# Patient Record
Sex: Female | Born: 1935 | Race: White | Hispanic: No | Marital: Married | Smoking: Never smoker
Health system: Southern US, Community
[De-identification: ages and names within clinical notes are randomized; demographics above are authoritative.]

## PROBLEM LIST (undated history)

## (undated) DIAGNOSIS — I10 Essential (primary) hypertension: Secondary | ICD-10-CM

## (undated) DIAGNOSIS — K5900 Constipation, unspecified: Secondary | ICD-10-CM

## (undated) DIAGNOSIS — I639 Cerebral infarction, unspecified: Secondary | ICD-10-CM

## (undated) DIAGNOSIS — M1990 Unspecified osteoarthritis, unspecified site: Secondary | ICD-10-CM

## (undated) DIAGNOSIS — E039 Hypothyroidism, unspecified: Secondary | ICD-10-CM

## (undated) DIAGNOSIS — E669 Obesity, unspecified: Secondary | ICD-10-CM

## (undated) DIAGNOSIS — E785 Hyperlipidemia, unspecified: Secondary | ICD-10-CM

## (undated) HISTORY — DX: Cerebral infarction, unspecified: I63.9

## (undated) HISTORY — DX: Obesity, unspecified: E66.9

## (undated) HISTORY — DX: Hypothyroidism, unspecified: E03.9

## (undated) HISTORY — DX: Unspecified osteoarthritis, unspecified site: M19.90

## (undated) HISTORY — DX: Hyperlipidemia, unspecified: E78.5

## (undated) HISTORY — PX: JOINT REPLACEMENT: SHX530

## (undated) HISTORY — DX: Essential (primary) hypertension: I10

## (undated) HISTORY — DX: Constipation, unspecified: K59.00

---

## 2001-02-27 ENCOUNTER — Other Ambulatory Visit: Admission: RE | Admit: 2001-02-27 | Discharge: 2001-02-27 | Payer: Self-pay | Admitting: Family Medicine

## 2001-02-27 ENCOUNTER — Encounter: Payer: Self-pay | Admitting: Family Medicine

## 2004-08-30 ENCOUNTER — Ambulatory Visit: Payer: Self-pay | Admitting: Family Medicine

## 2004-10-31 ENCOUNTER — Ambulatory Visit: Payer: Self-pay | Admitting: Family Medicine

## 2005-02-01 ENCOUNTER — Ambulatory Visit: Payer: Self-pay | Admitting: Unknown Physician Specialty

## 2005-04-27 ENCOUNTER — Ambulatory Visit: Payer: Self-pay | Admitting: Family Medicine

## 2005-05-02 ENCOUNTER — Ambulatory Visit: Payer: Self-pay | Admitting: Family Medicine

## 2005-05-16 ENCOUNTER — Ambulatory Visit: Payer: Self-pay | Admitting: Family Medicine

## 2005-06-21 ENCOUNTER — Ambulatory Visit: Payer: Self-pay | Admitting: Family Medicine

## 2005-10-03 ENCOUNTER — Ambulatory Visit: Payer: Self-pay | Admitting: Family Medicine

## 2005-11-02 ENCOUNTER — Ambulatory Visit: Payer: Self-pay | Admitting: Family Medicine

## 2006-05-04 ENCOUNTER — Ambulatory Visit: Payer: Self-pay | Admitting: Family Medicine

## 2006-05-07 ENCOUNTER — Ambulatory Visit: Payer: Self-pay | Admitting: Family Medicine

## 2006-05-24 ENCOUNTER — Ambulatory Visit: Payer: Self-pay | Admitting: Family Medicine

## 2006-07-03 ENCOUNTER — Ambulatory Visit: Payer: Self-pay | Admitting: Family Medicine

## 2006-08-29 ENCOUNTER — Ambulatory Visit: Payer: Self-pay | Admitting: Family Medicine

## 2006-09-26 ENCOUNTER — Ambulatory Visit: Payer: Self-pay | Admitting: Family Medicine

## 2006-11-07 ENCOUNTER — Ambulatory Visit: Payer: Self-pay | Admitting: Family Medicine

## 2007-03-08 ENCOUNTER — Ambulatory Visit: Payer: Self-pay | Admitting: Internal Medicine

## 2007-03-08 DIAGNOSIS — L538 Other specified erythematous conditions: Secondary | ICD-10-CM | POA: Insufficient documentation

## 2007-04-01 DIAGNOSIS — R209 Unspecified disturbances of skin sensation: Secondary | ICD-10-CM

## 2007-04-15 ENCOUNTER — Ambulatory Visit: Payer: Self-pay | Admitting: Internal Medicine

## 2007-04-18 ENCOUNTER — Ambulatory Visit: Payer: Self-pay | Admitting: Internal Medicine

## 2007-04-18 ENCOUNTER — Encounter: Payer: Self-pay | Admitting: Internal Medicine

## 2007-04-20 ENCOUNTER — Inpatient Hospital Stay: Payer: Self-pay | Admitting: Internal Medicine

## 2007-04-20 ENCOUNTER — Other Ambulatory Visit: Payer: Self-pay

## 2007-04-22 ENCOUNTER — Other Ambulatory Visit: Payer: Self-pay

## 2007-04-24 ENCOUNTER — Encounter: Payer: Self-pay | Admitting: Family Medicine

## 2007-04-25 ENCOUNTER — Encounter: Payer: Self-pay | Admitting: Internal Medicine

## 2007-05-06 ENCOUNTER — Ambulatory Visit: Payer: Self-pay | Admitting: Family Medicine

## 2007-05-06 DIAGNOSIS — E669 Obesity, unspecified: Secondary | ICD-10-CM | POA: Insufficient documentation

## 2007-05-06 DIAGNOSIS — E785 Hyperlipidemia, unspecified: Secondary | ICD-10-CM | POA: Insufficient documentation

## 2007-05-06 DIAGNOSIS — I6789 Other cerebrovascular disease: Secondary | ICD-10-CM | POA: Insufficient documentation

## 2007-05-06 LAB — CONVERTED CEMR LAB
AST: 23 units/L (ref 0–37)
Albumin: 3.8 g/dL (ref 3.5–5.2)
Alkaline Phosphatase: 65 units/L (ref 39–117)
BUN: 13 mg/dL (ref 6–23)
Basophils Absolute: 0 10*3/uL (ref 0.0–0.1)
Basophils Relative: 0.3 % (ref 0.0–1.0)
CO2: 29 meq/L (ref 19–32)
Chloride: 105 meq/L (ref 96–112)
Creatinine, Ser: 1.3 mg/dL — ABNORMAL HIGH (ref 0.4–1.2)
HCT: 43.8 % (ref 36.0–46.0)
Hemoglobin: 15.3 g/dL — ABNORMAL HIGH (ref 12.0–15.0)
MCHC: 35 g/dL (ref 30.0–36.0)
Monocytes Relative: 9.8 % (ref 3.0–11.0)
Neutrophils Relative %: 61.2 % (ref 43.0–77.0)
RBC: 4.84 M/uL (ref 3.87–5.11)
RDW: 12.5 % (ref 11.5–14.6)
Total Bilirubin: 1.3 mg/dL — ABNORMAL HIGH (ref 0.3–1.2)
Total CHOL/HDL Ratio: 6.4
Total Protein: 6.4 g/dL (ref 6.0–8.3)
Triglycerides: 406 mg/dL (ref 0–149)
VLDL: 81 mg/dL — ABNORMAL HIGH (ref 0–40)

## 2007-05-07 ENCOUNTER — Encounter: Payer: Self-pay | Admitting: Family Medicine

## 2007-05-07 DIAGNOSIS — E039 Hypothyroidism, unspecified: Secondary | ICD-10-CM | POA: Insufficient documentation

## 2007-05-07 DIAGNOSIS — M129 Arthropathy, unspecified: Secondary | ICD-10-CM

## 2007-05-13 ENCOUNTER — Ambulatory Visit: Payer: Self-pay | Admitting: Family Medicine

## 2007-05-13 DIAGNOSIS — I10 Essential (primary) hypertension: Secondary | ICD-10-CM

## 2007-05-14 ENCOUNTER — Telehealth: Payer: Self-pay | Admitting: Family Medicine

## 2007-05-14 ENCOUNTER — Other Ambulatory Visit: Payer: Self-pay

## 2007-05-14 ENCOUNTER — Encounter: Payer: Self-pay | Admitting: Family Medicine

## 2007-05-14 ENCOUNTER — Emergency Department: Payer: Self-pay | Admitting: Emergency Medicine

## 2007-05-16 ENCOUNTER — Ambulatory Visit: Payer: Self-pay | Admitting: Family Medicine

## 2007-05-16 DIAGNOSIS — Z8673 Personal history of transient ischemic attack (TIA), and cerebral infarction without residual deficits: Secondary | ICD-10-CM | POA: Insufficient documentation

## 2007-05-24 ENCOUNTER — Encounter: Payer: Self-pay | Admitting: Internal Medicine

## 2007-05-27 ENCOUNTER — Encounter: Payer: Self-pay | Admitting: Family Medicine

## 2007-06-18 ENCOUNTER — Ambulatory Visit: Payer: Self-pay | Admitting: Neurology

## 2007-06-24 ENCOUNTER — Encounter: Payer: Self-pay | Admitting: Internal Medicine

## 2007-06-26 ENCOUNTER — Ambulatory Visit: Payer: Self-pay | Admitting: Family Medicine

## 2007-07-24 ENCOUNTER — Encounter: Payer: Self-pay | Admitting: Internal Medicine

## 2007-08-07 ENCOUNTER — Ambulatory Visit: Payer: Self-pay | Admitting: Family Medicine

## 2007-08-24 ENCOUNTER — Encounter: Payer: Self-pay | Admitting: Internal Medicine

## 2007-08-28 ENCOUNTER — Encounter: Payer: Self-pay | Admitting: Family Medicine

## 2007-09-23 ENCOUNTER — Encounter: Payer: Self-pay | Admitting: Internal Medicine

## 2007-09-25 ENCOUNTER — Telehealth (INDEPENDENT_AMBULATORY_CARE_PROVIDER_SITE_OTHER): Payer: Self-pay | Admitting: *Deleted

## 2007-09-30 ENCOUNTER — Encounter: Payer: Self-pay | Admitting: Family Medicine

## 2007-10-02 ENCOUNTER — Ambulatory Visit: Payer: Self-pay | Admitting: Family Medicine

## 2007-10-02 ENCOUNTER — Encounter: Payer: Self-pay | Admitting: Family Medicine

## 2007-10-03 ENCOUNTER — Encounter (INDEPENDENT_AMBULATORY_CARE_PROVIDER_SITE_OTHER): Payer: Self-pay | Admitting: *Deleted

## 2007-10-03 ENCOUNTER — Telehealth: Payer: Self-pay | Admitting: Family Medicine

## 2007-10-08 ENCOUNTER — Telehealth (INDEPENDENT_AMBULATORY_CARE_PROVIDER_SITE_OTHER): Payer: Self-pay | Admitting: *Deleted

## 2007-10-24 ENCOUNTER — Encounter: Payer: Self-pay | Admitting: Internal Medicine

## 2007-11-24 ENCOUNTER — Encounter: Payer: Self-pay | Admitting: Internal Medicine

## 2007-12-02 ENCOUNTER — Ambulatory Visit: Payer: Self-pay | Admitting: Family Medicine

## 2007-12-02 DIAGNOSIS — K5909 Other constipation: Secondary | ICD-10-CM

## 2008-02-03 ENCOUNTER — Ambulatory Visit: Payer: Self-pay | Admitting: Family Medicine

## 2008-02-03 LAB — CONVERTED CEMR LAB
Alkaline Phosphatase: 53 units/L (ref 39–117)
Bilirubin, Direct: 0.1 mg/dL (ref 0.0–0.3)
CO2: 28 meq/L (ref 19–32)
Chloride: 106 meq/L (ref 96–112)
Cholesterol: 223 mg/dL (ref 0–200)
GFR calc Af Amer: 51 mL/min
Glucose, Bld: 95 mg/dL (ref 70–99)
HDL: 38.2 mg/dL — ABNORMAL LOW (ref >39.0)
Lymphocytes Relative: 26.2 % (ref 12.0–46.0)
Monocytes Absolute: 0.6 10*3/uL (ref 0.1–1.0)
Monocytes Relative: 10.2 % (ref 3.0–12.0)
Neutrophils Relative %: 60 % (ref 43.0–77.0)
Platelets: 205 10*3/uL (ref 150–400)
Potassium: 4.2 meq/L (ref 3.5–5.1)
RDW: 12.4 % (ref 11.5–14.6)
Sodium: 141 meq/L (ref 135–145)
TSH: 3.14 microintl units/mL (ref 0.35–5.50)
Total CHOL/HDL Ratio: 5.8
Total Protein: 6.2 g/dL (ref 6.0–8.3)
Triglycerides: 238 mg/dL (ref 0–149)
VLDL: 48 mg/dL — ABNORMAL HIGH (ref 0–40)

## 2008-02-05 ENCOUNTER — Ambulatory Visit: Payer: Self-pay | Admitting: Family Medicine

## 2008-03-02 ENCOUNTER — Ambulatory Visit: Payer: Self-pay | Admitting: Family Medicine

## 2008-03-02 LAB — CONVERTED CEMR LAB
OCCULT 1: NEGATIVE
OCCULT 3: NEGATIVE

## 2008-03-03 ENCOUNTER — Encounter (INDEPENDENT_AMBULATORY_CARE_PROVIDER_SITE_OTHER): Payer: Self-pay | Admitting: *Deleted

## 2008-03-18 ENCOUNTER — Telehealth (INDEPENDENT_AMBULATORY_CARE_PROVIDER_SITE_OTHER): Payer: Self-pay | Admitting: *Deleted

## 2008-03-23 ENCOUNTER — Telehealth: Payer: Self-pay | Admitting: Family Medicine

## 2008-03-26 ENCOUNTER — Telehealth: Payer: Self-pay | Admitting: Family Medicine

## 2008-11-19 ENCOUNTER — Ambulatory Visit: Payer: Self-pay | Admitting: Internal Medicine

## 2009-03-12 ENCOUNTER — Ambulatory Visit: Payer: Self-pay | Admitting: Unknown Physician Specialty

## 2009-05-13 IMAGING — US US CAROTID DUPLEX BILAT
1 series · 17 of 24 positions shown · non-contrast
Comparison: none

REASON FOR EXAM: paresthesia
COMMENTS:

[Series 1: us carotid duplex bilat · 17 of 71 slices shown]
[im 1/71]
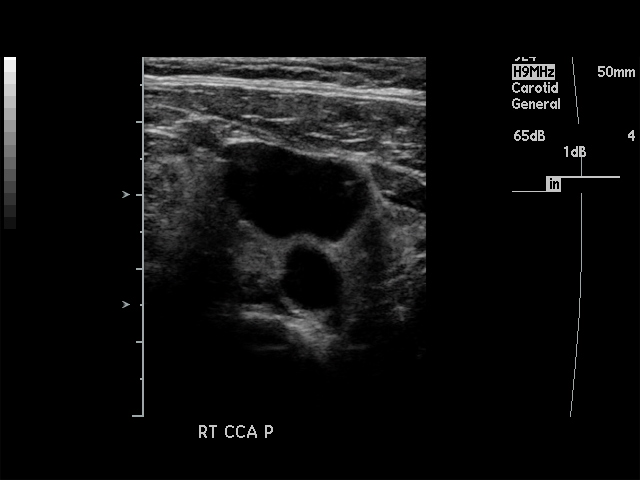
[im 7/71]
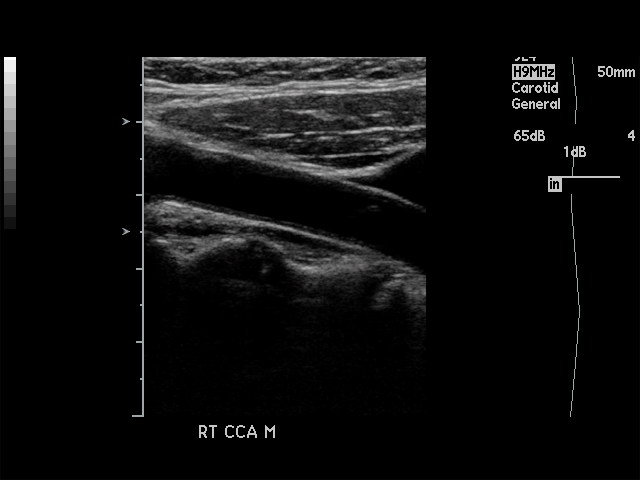
[im 10/71]
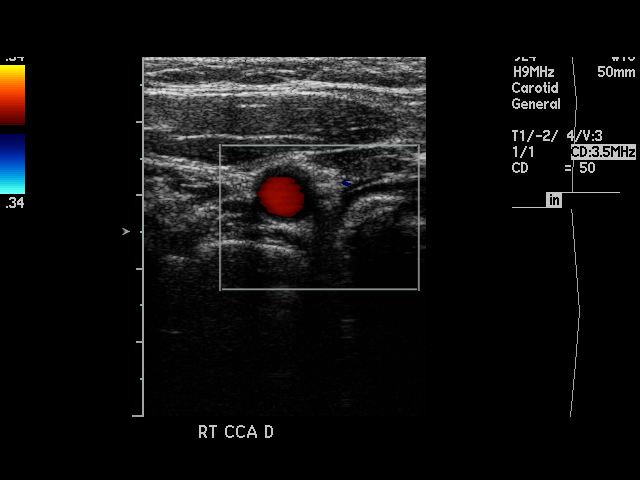
[im 13/71]
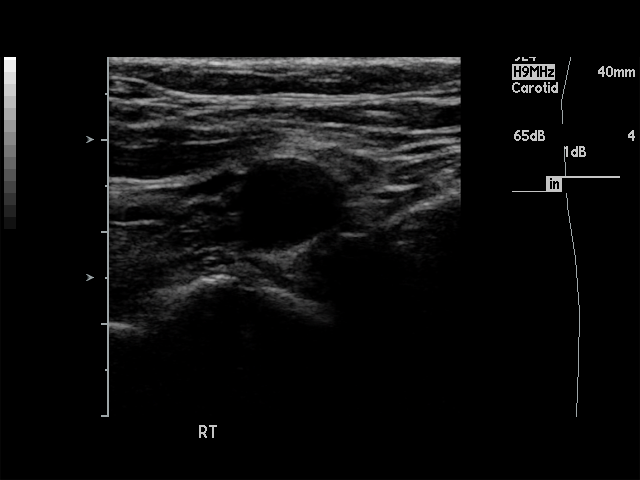
[im 19/71]
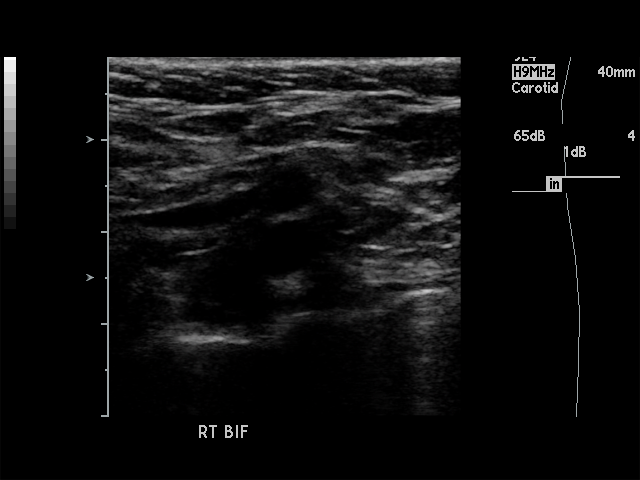
[im 22/71]
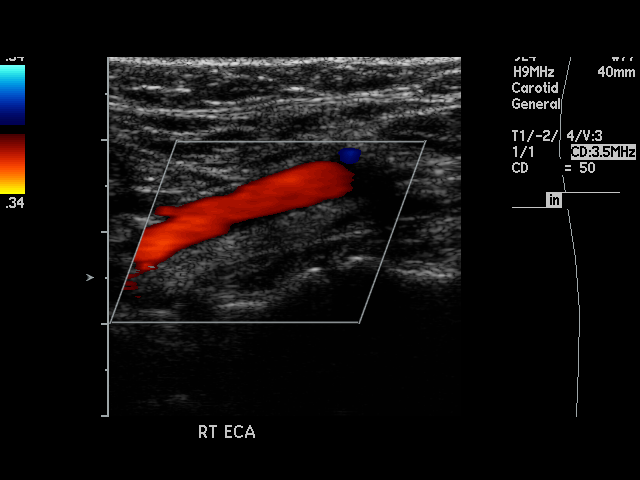
[im 28/71]
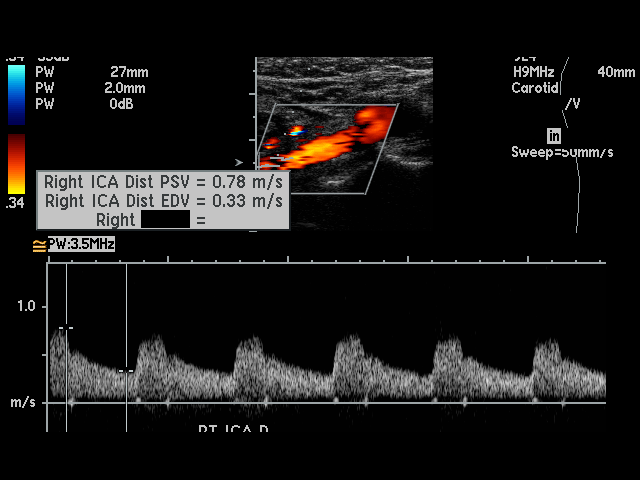
[im 31/71]
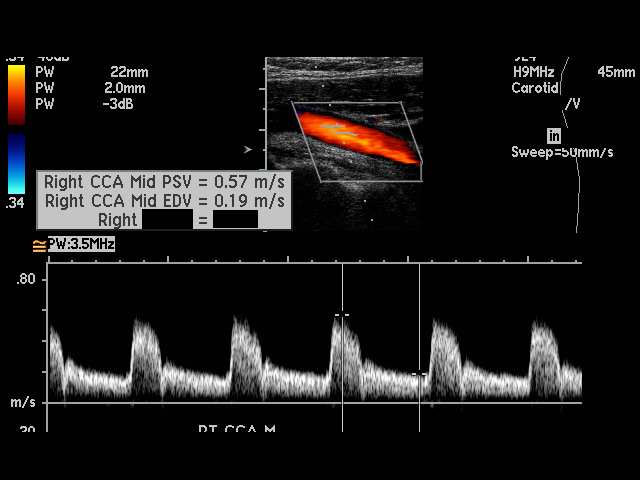
[im 37/71]
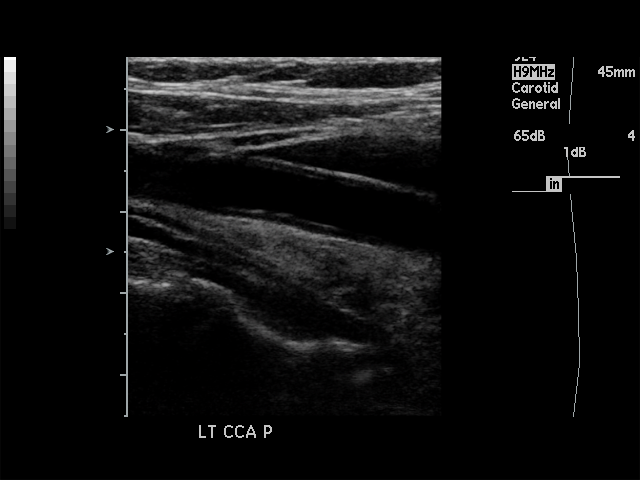
[im 40/71]
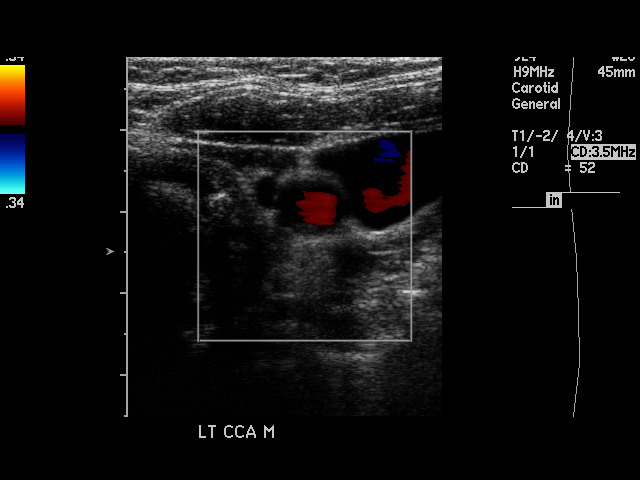
[im 43/71]
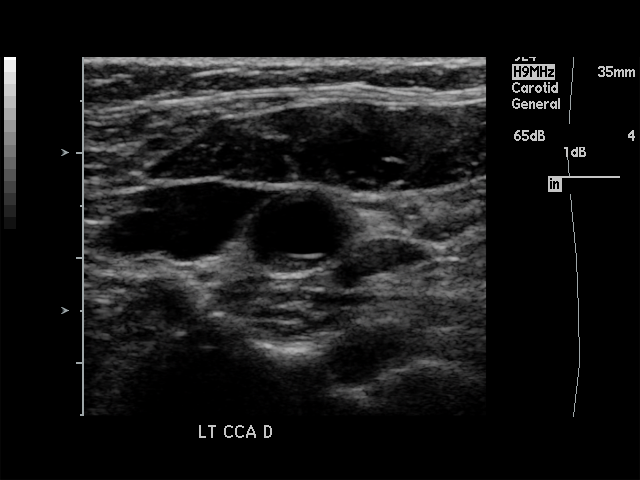
[im 49/71]
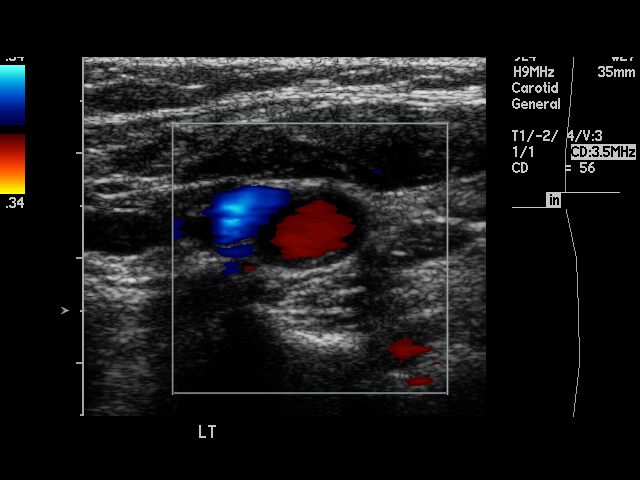
[im 52/71]
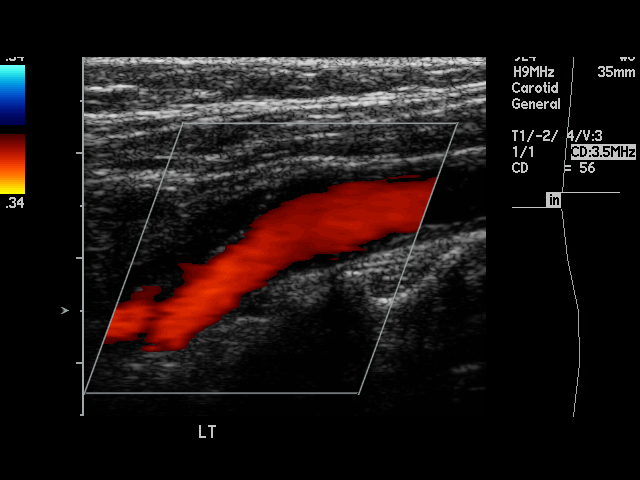
[im 58/71]
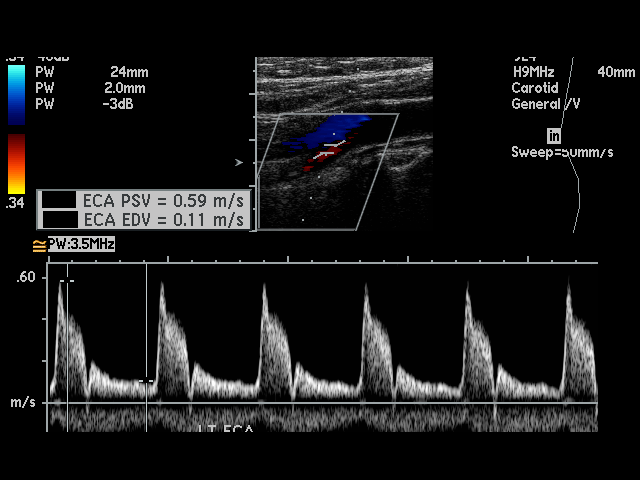
[im 61/71]
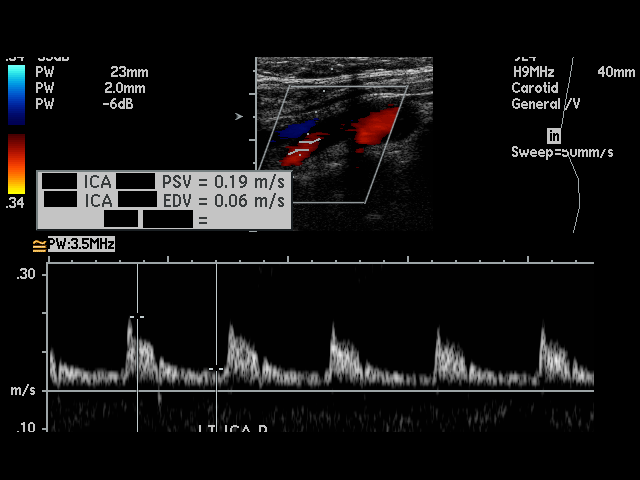
[im 64/71]
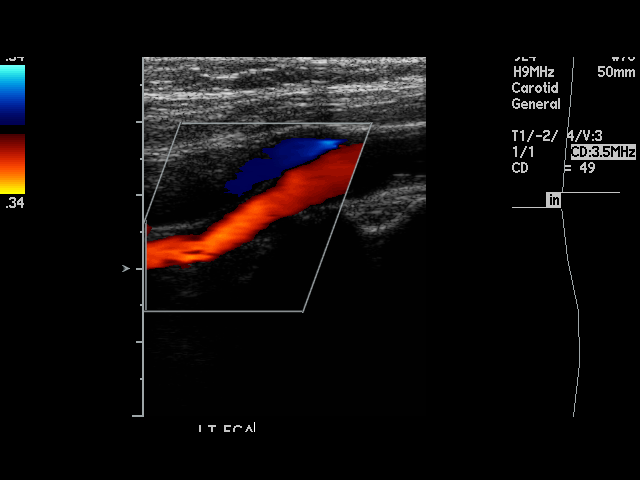
[im 71/71]
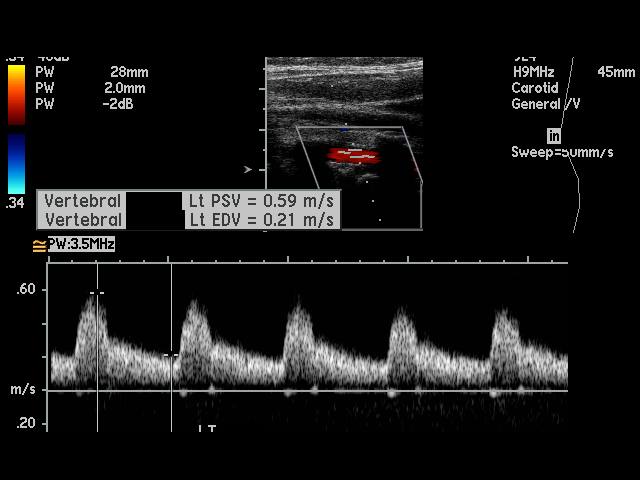

[17 of 24 positions shown; findings below may reference images not displayed]

PROCEDURE:     US  - US CAROTID DOPPLER BILATERAL  - April 18, 2007 [DATE]

RESULT:      There is noted slight intimal thickening in the distal common
carotid arteries bilaterally. No calcific plaque formation is seen on either
side.

On the RIGHT, the peak RIGHT common carotid artery flow velocity measures
0.58 meters/second and the peak RIGHT internal carotid artery flow velocity
measures 0.78 meters/second.  IC/CC ratio is 1.35.

On the LEFT, the peak LEFT common carotid artery flow velocity measures
meters/second and the peak LEFT internal carotid artery flow velocity
measures 0.39 meters/second. The IC/CC ratio 0.71. These values bilaterally
are consistent with the absence of hemodynamically significant stenosis.

There is antegrade flow in both vertebrals.
IMPRESSION: 1. No stenosis is identified on either side.
2. Slight intimal thickening is noted bilaterally in the distal common
carotid arteries.
3. Antegrade flow is present in both vertebrals.

## 2009-12-29 ENCOUNTER — Ambulatory Visit: Payer: Self-pay | Admitting: Unknown Physician Specialty

## 2010-01-10 ENCOUNTER — Ambulatory Visit: Payer: Self-pay | Admitting: Internal Medicine

## 2010-01-26 ENCOUNTER — Ambulatory Visit: Payer: Self-pay | Admitting: Unknown Physician Specialty

## 2010-05-31 ENCOUNTER — Encounter (INDEPENDENT_AMBULATORY_CARE_PROVIDER_SITE_OTHER): Payer: Self-pay | Admitting: *Deleted

## 2010-11-11 ENCOUNTER — Ambulatory Visit: Payer: Self-pay | Admitting: Ophthalmology

## 2010-11-24 NOTE — Letter (Signed)
Summary: Nadara Eaton letter  [place] at [facility]  [address] [place], [place] [address]   Phone: [phone]  Fax: [phone]       05/31/2010 MRN: [id number]  [facility] [address] [place], [place]  [address]  Dear Ms. [facility] [place] and [place] announce the retirement of Arta Silence, M.D., from full-time practice at the [facility] office effective April 21, 2010 and his plans of returning part-time.  It is important to Dr. Hetty Ely and to our practice that you understand that [facility] has seven physicians in our office for your health care needs.  We will continue to offer the same exceptional care that you have today.    Dr. Hetty Ely has spoken to many of you about his plans for retirement and returning part-time in the fall.   We will continue to work with you through the transition to schedule appointments for you in the office and meet the high standards that [facility] is committed to.   Again, it is with great pleasure that we share the news that Dr. Hetty Ely will return to [facility] at [facility] in October of 2011 with a reduced schedule.    If you have any questions, or would like to request an appointment with one of our physicians, please call us at [phone] and press the option for Scheduling an appointment.  We take pleasure in providing you with excellent patient care and look forward to seeing you at your next office visit.  Our [facility] Physicians are:  Tillman Abide, M.D. Laurita Quint, M.D. Roxy Manns, M.D. Kerby Nora, M.D. Hannah Beat, M.D. Ruthe Mannan, M.D. We proudly welcomed Raechel Ache, M.D. and Eustaquio Boyden, M.D. to the practice in July/August 2011.  Sincerely,  [facility]

## 2011-01-13 ENCOUNTER — Ambulatory Visit: Payer: Self-pay | Admitting: Internal Medicine

## 2011-02-23 ENCOUNTER — Ambulatory Visit: Payer: Self-pay | Admitting: Orthopedic Surgery

## 2011-03-02 ENCOUNTER — Inpatient Hospital Stay: Payer: Self-pay | Admitting: Orthopedic Surgery

## 2011-03-16 ENCOUNTER — Inpatient Hospital Stay: Payer: Self-pay | Admitting: Internal Medicine

## 2011-03-24 DIAGNOSIS — N179 Acute kidney failure, unspecified: Secondary | ICD-10-CM

## 2011-03-24 DIAGNOSIS — E039 Hypothyroidism, unspecified: Secondary | ICD-10-CM

## 2011-03-24 DIAGNOSIS — I82409 Acute embolism and thrombosis of unspecified deep veins of unspecified lower extremity: Secondary | ICD-10-CM

## 2011-03-24 DIAGNOSIS — I959 Hypotension, unspecified: Secondary | ICD-10-CM

## 2011-03-24 DIAGNOSIS — Z96659 Presence of unspecified artificial knee joint: Secondary | ICD-10-CM

## 2011-03-24 DIAGNOSIS — F329 Major depressive disorder, single episode, unspecified: Secondary | ICD-10-CM

## 2011-03-24 DIAGNOSIS — D72829 Elevated white blood cell count, unspecified: Secondary | ICD-10-CM

## 2011-03-29 DIAGNOSIS — Z7901 Long term (current) use of anticoagulants: Secondary | ICD-10-CM

## 2011-04-03 ENCOUNTER — Telehealth: Payer: Self-pay | Admitting: *Deleted

## 2011-04-03 DIAGNOSIS — L03119 Cellulitis of unspecified part of limb: Secondary | ICD-10-CM

## 2011-04-03 DIAGNOSIS — L02419 Cutaneous abscess of limb, unspecified: Secondary | ICD-10-CM

## 2011-04-03 NOTE — Telephone Encounter (Signed)
Triage Record Num: [id number] Operator: Alphonsa Overall Patient Name: Nataleigh Griffin Call Date & Time: 04/01/2011 4:48:07PM Patient Phone: [phone] PCP: Tillman Abide Patient Gender: Female PCP Fax : [phone] Patient DOB: 01/07/1929 Practice Name: [facility] Reason for Call: Lynden Ang LPN/ [facility] calling about left knee/leg swelling. S/P left knee patella replacement <03/23/11. Hx DVT. On Coumadin INR 1.80. Red,warm and swollen. Denies pain. 98.6temp,150/60,HR 72, 98percent O2 sat. Lynden Ang states look worse 04/01/11. No recent evaluation by provider since 03/28/11. Ongoing swelling. Per Dr Laury Axon put on abx Keflex 500mg  BID x 10days. Increase in redness/pain ED. Cathy aware. Protocol(s) Used: Edema, Atraumatic Recommended Outcome per Protocol: See Provider within 24 hours Override Outcome if Used in Protocol: Call Provider Immediately RN Reason for Override Outcome: Nursing Judgement Used. Reason for Outcome: Associated with signs and symptoms of localized infection Care Advice: ~ SYMPTOM / CONDITION MANAGEMENT Position affected part so it is elevated at least 12 inches (30 cm) above level of heart to improve circulation and decrease discomfort. ~ ~ List, or take, all current prescription(s), nonprescription or alternative medication(s) to provider for evaluation. See a provider within 4 hours if affected area increases in size quickly, red streaks extend from area, or area becomes blistered. ~ 04/01/2011 5:46:57PM Page 1 of 1 [id number]

## 2011-05-03 ENCOUNTER — Encounter: Payer: Self-pay | Admitting: Physician Assistant

## 2011-05-24 ENCOUNTER — Encounter: Payer: Self-pay | Admitting: Physician Assistant

## 2011-07-26 ENCOUNTER — Ambulatory Visit: Payer: Self-pay | Admitting: Ophthalmology

## 2011-07-26 DIAGNOSIS — I119 Hypertensive heart disease without heart failure: Secondary | ICD-10-CM

## 2011-08-07 ENCOUNTER — Ambulatory Visit: Payer: Self-pay | Admitting: Ophthalmology

## 2011-09-18 ENCOUNTER — Ambulatory Visit: Payer: Self-pay | Admitting: Ophthalmology

## 2011-10-26 ENCOUNTER — Ambulatory Visit: Payer: Self-pay | Admitting: Orthopedic Surgery

## 2011-11-20 ENCOUNTER — Ambulatory Visit: Payer: Self-pay | Admitting: Orthopedic Surgery

## 2012-02-13 ENCOUNTER — Ambulatory Visit: Payer: Self-pay | Admitting: Internal Medicine

## 2012-03-14 ENCOUNTER — Ambulatory Visit: Payer: Self-pay | Admitting: Ophthalmology

## 2012-11-12 ENCOUNTER — Ambulatory Visit: Payer: Self-pay

## 2013-02-26 ENCOUNTER — Ambulatory Visit: Payer: Self-pay | Admitting: Internal Medicine

## 2013-12-15 IMAGING — US US EXTREM LOW VENOUS*L*
1 series · 17 of 24 positions shown · non-contrast
Comparison: none

REASON FOR EXAM: [id number] Eval for DVT pain swelling lower left
COMMENTS:

[Series 1: us extrem low venous*left* · 17 of 27 slices shown]
[im 1/27]
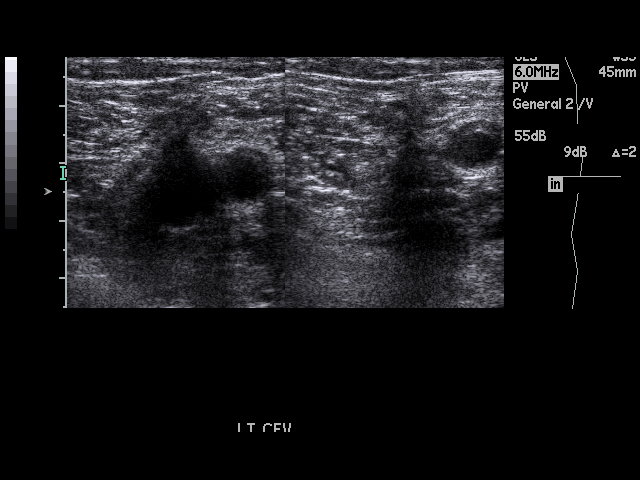
[im 3/27]
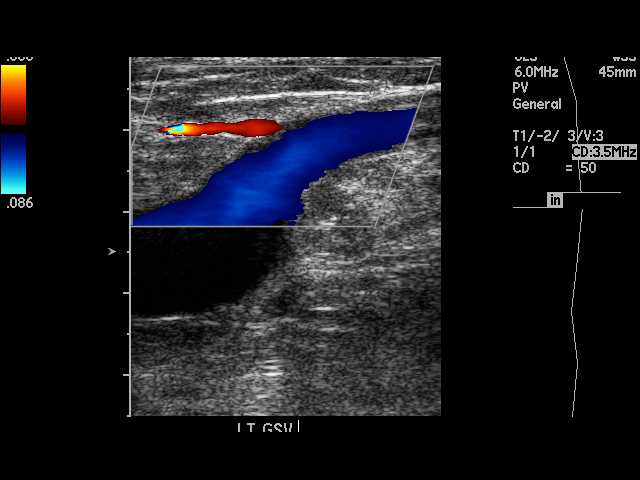
[im 4/27]
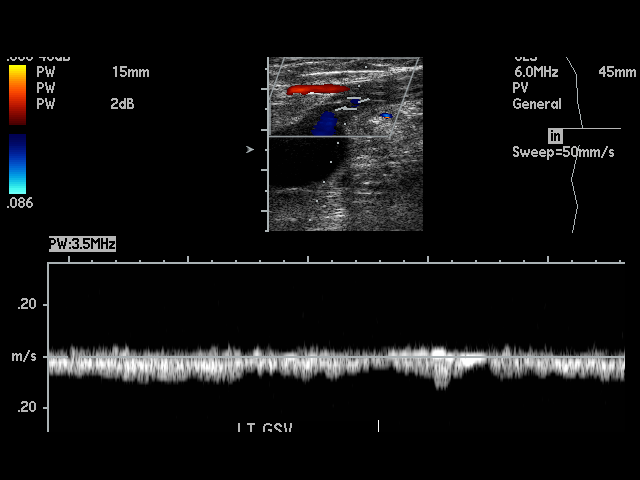
[im 5/27]
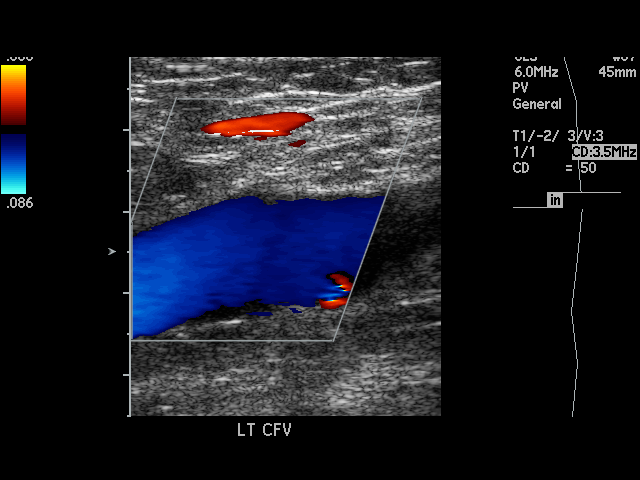
[im 7/27]
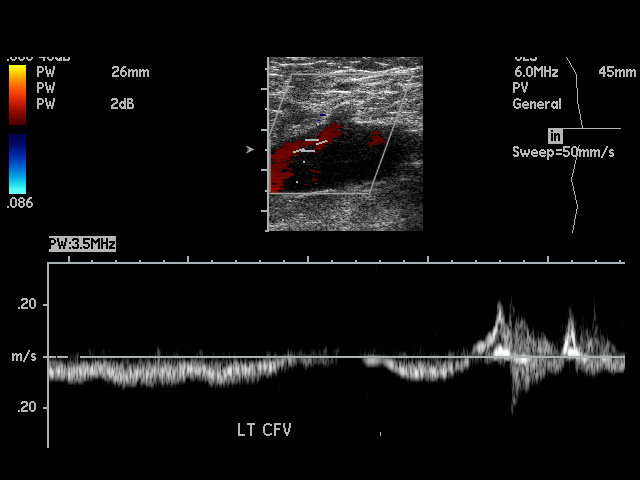
[im 8/27]
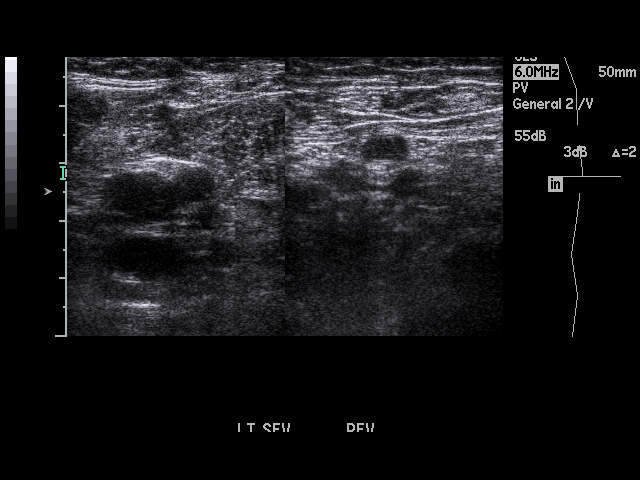
[im 11/27]
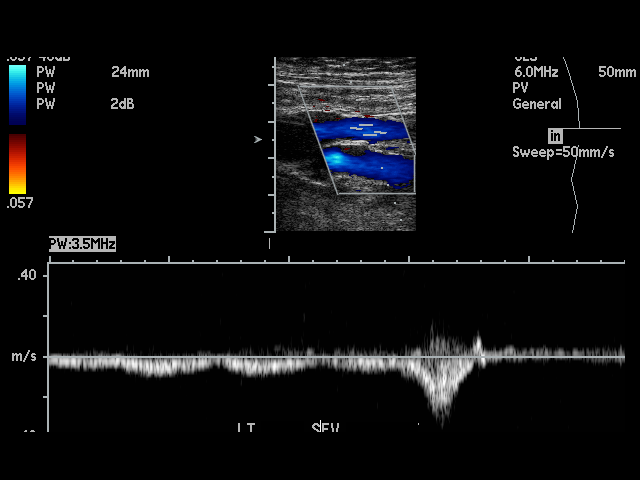
[im 12/27]
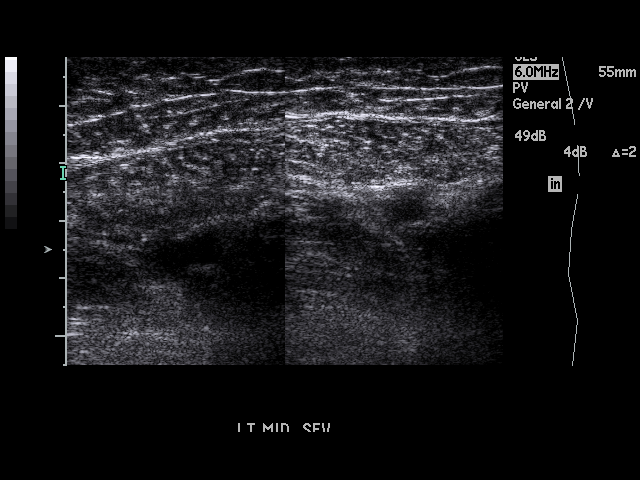
[im 14/27]
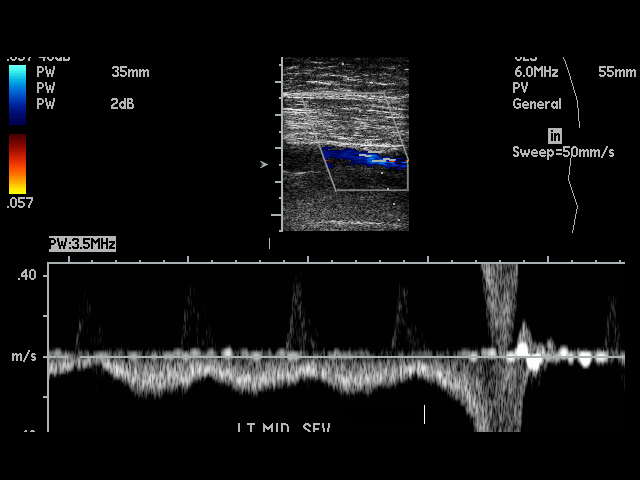
[im 15/27]
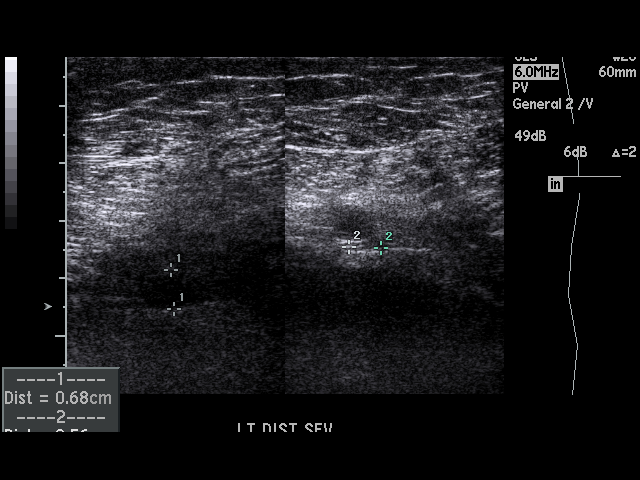
[im 16/27]
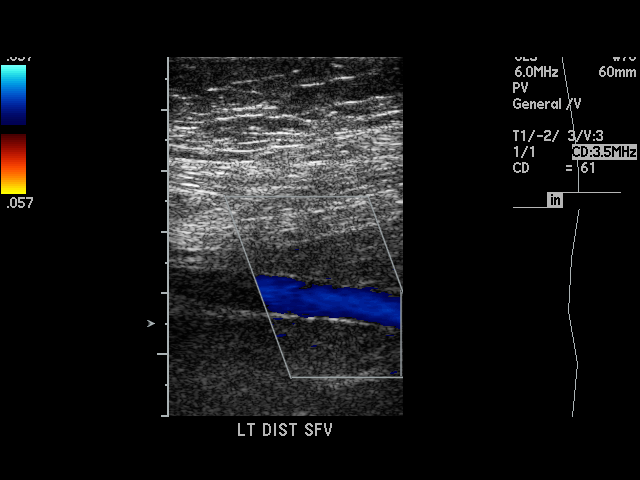
[im 19/27]
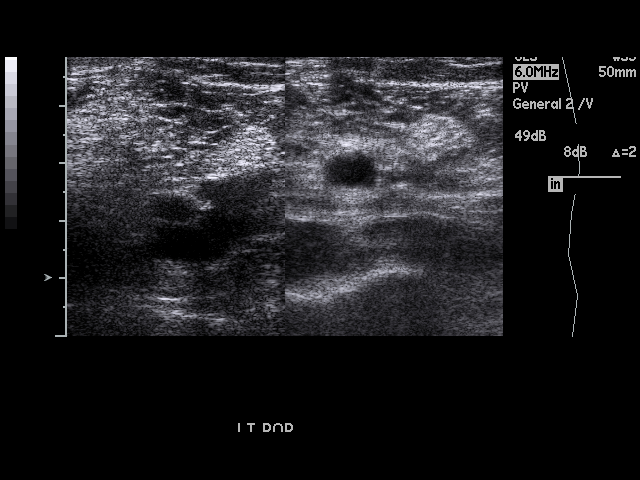
[im 20/27]
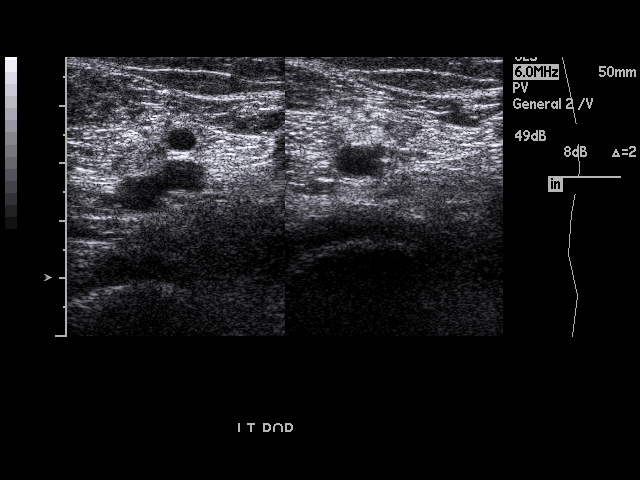
[im 22/27]
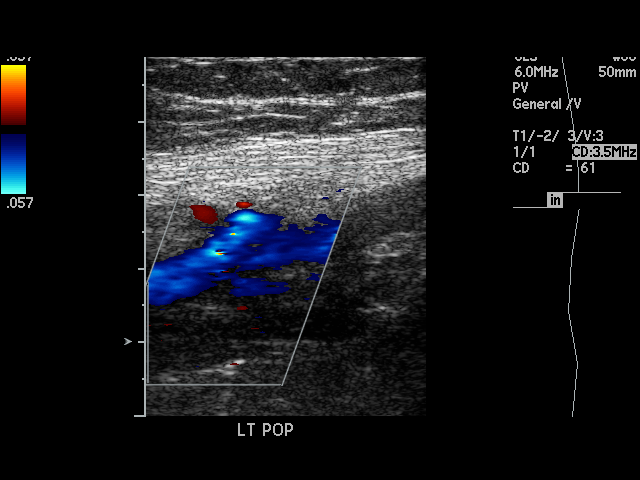
[im 23/27]
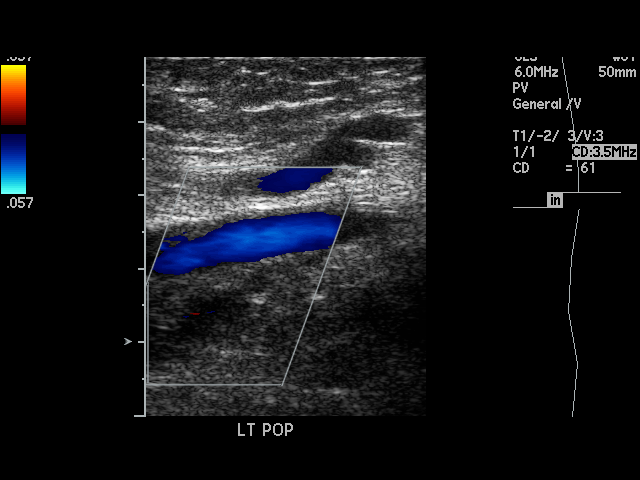
[im 24/27]
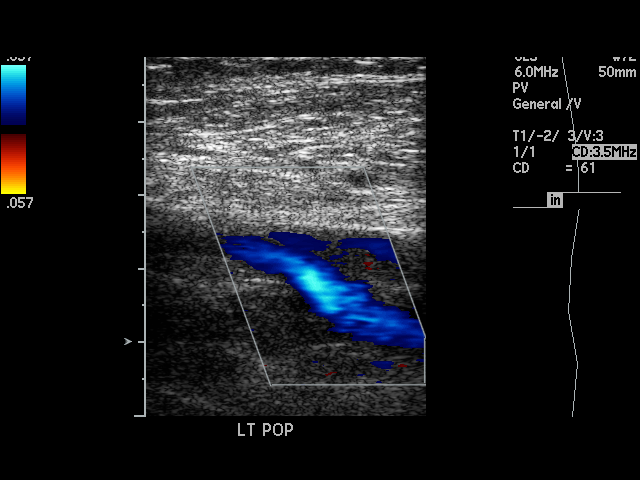
[im 27/27]
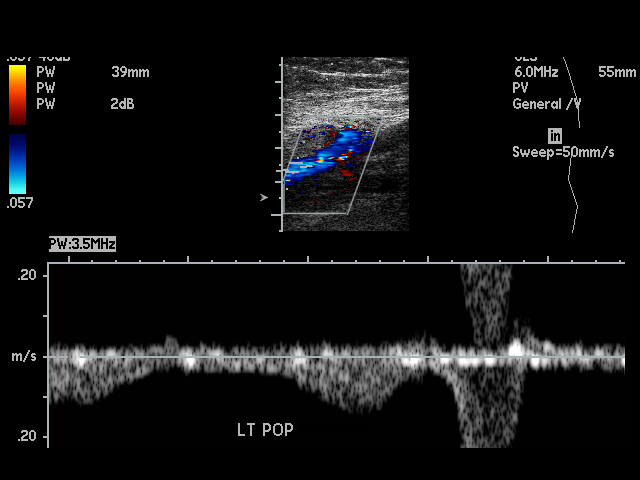

[17 of 24 positions shown; findings below may reference images not displayed]

PROCEDURE:     US  - US DOPPLER LOW EXTR LEFT  - November 20, 2011  [DATE]

RESULT:     Doppler interrogation of the deep venous system of the left leg
is performed from the common femoral vein through the popliteal vein.
Compression gray scale images demonstrate normal compressibility of the deep
venous structures. Color and spectral Doppler appearance is normal.
IMPRESSION: No evidence of left lower extremity deep vein thrombosis.
The previous left lower extremity DVT appears to have resolved.

## 2015-02-14 NOTE — Op Note (Signed)
PATIENT NAME:  Jenna Prince, Jenna Prince MR#:  [id number] DATE OF BIRTH:  [birth year]  DATE OF PROCEDURE:  10/26/2011  POSTOPERATIVE DIAGNOSIS: Left knee medial meniscus tear.   POSTOPERATIVE DIAGNOSIS: Left knee medial meniscus tear with lateral meniscus tear.   PROCEDURES: Partial arthroscopy, left knee, with partial medial and lateral meniscectomy.   SURGEON: Leitha SchullerMichael J. Tresean Mattix, M.D.   ANESTHESIA: General.   DESCRIPTION OF PROCEDURE: The patient was brought to the Operating Room and after adequate anesthesia was obtained the left leg was placed in the arthroscopic legholder along with a tourniquet after prepping and draping in the usual sterile fashion, patient identification and time out procedures were completed. Preoperative antibiotics were given secondary to prior partial knee replacement. An inferolateral portal was made and the arthroscope was introduced. Initial inspection revealed some fibrous tissue at the superior aspect of the patellar component which had previously been placed. This was subsequently removed with use of an ArthroCare wand. The patellar implant and trochlear implant appeared to have stable fixation and there was good tracking. There was also a little bit of a band proximally, in the suprapatellar pouch, which was opened up to allow for more normal suprapatellar pouch. It appeared to be related to old scar tissue from prior surgery. Coming around medially an inferomedial portal was made and subsequent to taking care of the meniscus tear the suprapatellar pouch was addressed. Initial inspection revealed some mild medial compartment degenerative changes with some fibrillation of the articular cartilage. On probing there was a tear of the posterior horn of the medial meniscus. This was debrided with use of meniscal punch after assessing the extent involving approximately the inner third of the posterior meniscus. The anterior cruciate ligament was intact. Going to the lateral compartment there  was a very large tear of the posterior horn extending to the middle third.  It had almost the appearance of a discoid meniscus. There was more significant degenerative change to the lateral compartment. After assessing the meniscus tear, it was first addressed with a shaver followed by an ArthroCare wand getting back to stable margins with pre and postprocedure pictures having been obtained. It was at this point that the ArthroCare wand was used to release some scar tissue along the proximal extent of the patellar component. Again the gutters were checked and there were no loose bodies. The knee was thoroughly irrigated until clear. All instrumentation was withdrawn. The wounds were closed with simple interrupted 4-0 nylon. Sterile dressings of Xeroform, 4 x 4's, Webril, and Ace wrap were applied. The patient was sent to the Recovery Room in stable condition.   ESTIMATED BLOOD LOSS: Minimal.   COMPLICATIONS: None.   SPECIMEN: None.  ____________________________ Leitha SchullerMichael J. Lailyn Appelbaum, MD mjm:slb D: 10/26/2011 19:39:22 ET T: 10/27/2011 09:38:08 ET JOB#: [id number]  cc: Leitha SchullerMichael J. Jermayne Sweeney, MD, <Dictator> Leitha SchullerMICHAEL J Yachet Mattson MD ELECTRONICALLY SIGNED 10/27/2011 12:35

## 2017-02-07 ENCOUNTER — Emergency Department
Admission: EM | Admit: 2017-02-07 | Discharge: 2017-02-07 | Disposition: A | Discharge status: Home / Self Care | Payer: Medicare Other | Attending: Emergency Medicine | Admitting: Emergency Medicine

## 2017-02-07 ENCOUNTER — Encounter: Payer: Self-pay | Admitting: Emergency Medicine

## 2017-02-07 DIAGNOSIS — I10 Essential (primary) hypertension: Secondary | ICD-10-CM | POA: Diagnosis not present

## 2017-02-07 DIAGNOSIS — K625 Hemorrhage of anus and rectum: Secondary | ICD-10-CM | POA: Insufficient documentation

## 2017-02-07 DIAGNOSIS — K5900 Constipation, unspecified: Secondary | ICD-10-CM | POA: Diagnosis present

## 2017-02-07 DIAGNOSIS — E039 Hypothyroidism, unspecified: Secondary | ICD-10-CM | POA: Diagnosis not present

## 2017-02-07 LAB — CBC WITH DIFFERENTIAL/PLATELET
BASOS ABS: 0 10*3/uL (ref 0–0.1)
Basophils Relative: 1 %
Eosinophils Absolute: 0.2 10*3/uL (ref 0–0.7)
Eosinophils Relative: 2 %
HEMATOCRIT: 41.5 % (ref 35.0–47.0)
Hemoglobin: 13.9 g/dL (ref 12.0–16.0)
LYMPHS PCT: 22 %
Lymphs Abs: 1.5 10*3/uL (ref 1.0–3.6)
MCH: 32.3 pg (ref 26.0–34.0)
MCHC: 33.4 g/dL (ref 32.0–36.0)
MCV: 96.7 fL (ref 80.0–100.0)
MONO ABS: 0.7 10*3/uL (ref 0.2–0.9)
MONOS PCT: 10 %
NEUTROS ABS: 4.4 10*3/uL (ref 1.4–6.5)
Neutrophils Relative %: 65 %
Platelets: 180 10*3/uL (ref 150–440)
RBC: 4.29 MIL/uL (ref 3.80–5.20)
RDW: 14 % (ref 11.5–14.5)
WBC: 6.8 10*3/uL (ref 3.6–11.0)

## 2017-02-07 LAB — BASIC METABOLIC PANEL
ANION GAP: 6 (ref 5–15)
BUN: 34 mg/dL — ABNORMAL HIGH (ref 6–20)
CO2: 25 mmol/L (ref 22–32)
Calcium: 9.2 mg/dL (ref 8.9–10.3)
Chloride: 107 mmol/L (ref 101–111)
Creatinine, Ser: 1.67 mg/dL — ABNORMAL HIGH (ref 0.44–1.00)
GFR calc Af Amer: 31 mL/min — ABNORMAL LOW (ref >60)
GFR calc non Af Amer: 27 mL/min — ABNORMAL LOW (ref >60)
GLUCOSE: 111 mg/dL — AB (ref 65–99)
POTASSIUM: 4.7 mmol/L (ref 3.5–5.1)
Sodium: 138 mmol/L (ref 135–145)

## 2017-02-07 MED ORDER — POLYETHYLENE GLYCOL 3350 17 GM/SCOOP PO POWD: 17.0000 g | ORAL | 0 refills | Status: AC

## 2017-02-07 MED ORDER — HYDROCORTISONE 2.5 % RE CREA: TOPICAL_CREAM | RECTAL | 1 refills | Status: AC

## 2017-02-07 MED ORDER — DOCUSATE SODIUM 100 MG PO CAPS: 100.0000 mg | ORAL_CAPSULE | Freq: Every day | ORAL | 2 refills | Status: AC

## 2017-02-07 NOTE — ED Notes (Signed)
Pt assisted off the toilet and placed back in bed with a diaper on. Pt was advised not to get up by herself and to push the call button for assistance.

## 2017-02-07 NOTE — ED Triage Notes (Signed)
Pt with no BM since Sunday. States that she is passing a little fluid. Has rectal pain 8/10.

## 2017-02-07 NOTE — ED Notes (Signed)
800 ml of Soap suds enema administered. Patient tolerated well.

## 2017-02-07 NOTE — ED Notes (Addendum)
Assisted patient to use toilet. Large amount of brown stool noted in brief and in commode.

## 2017-02-07 NOTE — ED Provider Notes (Signed)
[facility] Emergency Department Provider Note  Time seen: 5:39 PM  I have reviewed the triage vital signs and the nursing notes.   HISTORY  Chief Complaint Constipation    HPI Jenna Prince is a 83 y.o. female with a past medical history of constipation, hyperlipidemia, presents to the emergency department for constipation. According to the patient she has not had a bowel movement in the past 4 days which is abnormal for her. Patient states she has been having intermittent rectal bleeding when she attempts to have a bowel movement she describes as blood on the toilet paper. She also states rectal pain when attempting a bowel movement. Patient takes a stool softener and took 1 dose of MiraLAX without relief so she came to the emergency department. Denies any abdominal pain. Denies any fever.  Patient Active Problem List   Diagnosis Date Noted  . CONSTIPATION, RECURRENT 12/02/2007  . HX, PERSONAL, TIA AND CEREBRAL INFARCT 05/16/2007  . HYPERTENSION, BENIGN ESSENTIAL 05/13/2007  . HYPOTHYROIDISM 05/07/2007  . ARTHRITIS, KNEES, BILATERAL 05/07/2007  . HYPERLIPIDEMIA 05/06/2007  . OBESITY NOS 05/06/2007  . CEREBROVASCULAR ACCIDENT, ACUTE 05/06/2007  . PARESTHESIA 04/01/2007  . INTERTRIGO 03/08/2007    No past surgical history on file.  Prior to Admission medications   Not on File    Allergies  Allergen Reactions  . Celecoxib     REACTION: Rash  . Sulfacetamide Sodium     REACTION: Patient is allergic to sulfa type products.    No family history on file.  Social History Social History  Substance Use Topics  . Smoking status: Not on file  . Smokeless tobacco: Not on file  . Alcohol use Not on file    Review of Systems Constitutional: Negative for fever. Cardiovascular: Negative for chest pain. Respiratory: Negative for shortness of breath. Gastrointestinal: Negative for abdominal pain. Positive for constipation. Positive for rectal bleeding.   Neurological: Negative for headache 10-point ROS otherwise negative.  ____________________________________________   PHYSICAL EXAM:  Constitutional: Alert and oriented. Well appearing and in no distress. Eyes: Normal exam ENT   Head: Normocephalic and atraumatic.   Mouth/Throat: Mucous membranes are moist. Cardiovascular: Normal rate, regular rhythm. No murmur Respiratory: Normal respiratory effort without tachypnea nor retractions. Breath sounds are clear  Gastrointestinal: Soft and nontender. No distention.  Musculoskeletal: Nontender with normal range of motion in all extremities. Neurologic:  Normal speech and language. No gross focal neurologic deficits  Skin:  Skin is warm, dry and intact.  Psychiatric: Mood and affect are normal.   ____________________________________________   INITIAL IMPRESSION / ASSESSMENT AND PLAN / ED COURSE  Pertinent labs & imaging results that were available during my care of the patient were reviewed by me and considered in my medical decision making (see chart for details).  The patient presents to the emergency department for constipation. Patient as I had a bowel movement in 3 or 4 days. Rectal examination shows light brown stool, guaiac-negative. Patient does have small external hemorrhoids which could be a source of intermittent bleeding. Patient has hard stool in her rectum, digital disimpaction performed. We will try an enema for symptom relief. Patient's labs are normal, Besides mild acute on chronic renal insufficiency.   Patient has had multiple large bowel movements. We will discharge with Colace and MiraLAX to be taken every other day. Patient will follow up with her primary care doctor.  ____________________________________________   FINAL CLINICAL IMPRESSION(S) / ED DIAGNOSES  Constipation   Minna Antis, MD 02/07/17  1851  

## 2018-01-28 NOTE — Progress Notes (Signed)
01/29/2018 10:18 AM   Jenna Prince Dec 24, 1928 [id number]  Referring provider: Lauro RegulusAnderson, Marshall W, MD [address] [facility] [place] [facility] [place], [place] [id number]  Chief Complaint  Patient presents with  . Urinary Incontinence    HPI: Patient is a 84 -year-old Caucasian female who is referred to us by Dr. Einar CrowMarshall Anderson for urinary incontinence with her husband, Al.    Patient states that she has had urinary incontinence for one year.    Patient has incontinence with urge   She is experiencing several incontinent episodes during the day. She is experiencing one incontinent episodes during the night.  Her incontinence volume is moderate to large.   She is wearing one to two pads/depends daily.    She is having associated urinary frequency, nocturia x 2-3 and straining to urinate.  She's had a sacral pain for 2 weeks.  Patient denies any gross hematuria, dysuria or suprapubic/flank pain.  Patient denies any fevers, chills, nausea or vomiting.  Her PVR is 227 mL.    She does not have a history of urinary tract infections, STI's or injury to the bladder.   She does not have a history of nephrolithiasis, GU surgery or GU trauma.   She is post menopausal.   She admits to diarrhea.   She has not had any recent imaging studies.    She is not drinking a lot of water daily.   She is drinking 4 cups of coffee daily.  She drinks one glass of orange juice daily.  She drinks two to three glasses of sweet tea daily.  She does not drink sodas.  She does not drink alcohol.    Reviewed referral notes.   Patient on Vesicare 5 mg daily  PMH: Past Medical History:  Diagnosis Date  . Arthritis   . Constipation   . CVA (cerebral vascular accident) (HCC)   . CVA (cerebral vascular accident) (HCC)   . HLD (hyperlipidemia)   . Hypertension   . Hypothyroidism   . Obesity     Surgical History: Knee replacement 2012  Home Medications:  Allergies as of 01/29/2018    Reactions   Celecoxib    REACTION: Rash   Lisinopril Other (See Comments)   Other reaction(s): Other (See Comments) Angioedema on EGD Angioedema on EGD   Sulfacetamide Sodium    REACTION: Patient is allergic to sulfa type products.   Statins Rash      Medication List        Accurate as of 01/29/18 11:59 PM. Always use your most recent med list.          aspirin EC 81 MG tablet Take 81 mg by mouth daily.   citalopram 20 MG tablet Commonly known as:  CELEXA Take 20 mg by mouth daily.   conjugated estrogens vaginal cream Commonly known as:  PREMARIN Place 1 Applicatorful vaginally daily. Apply 0.5mg  (pea-sized amount)  just inside the vaginal introitus with a finger-tip on  Monday, Wednesday and Friday nights.   docusate sodium 100 MG capsule Commonly known as:  COLACE Take 1 capsule (100 mg total) by mouth daily.   estradiol 0.1 MG/GM vaginal cream Commonly known as:  ESTRACE VAGINAL Apply 0.5mg  (pea-sized amount)  just inside the vaginal introitus with a finger-tip on Monday, Wednesday and Friday nights.   hydrochlorothiazide 12.5 MG capsule Commonly known as:  MICROZIDE Take 12.5 mg by mouth daily.   hydrocortisone 2.5 % rectal cream Commonly known as:  ANUSOL-HC  Apply rectally 2 times daily   levothyroxine 50 MCG tablet Commonly known as:  SYNTHROID, LEVOTHROID Take 50 mcg by mouth daily before breakfast.   losartan 100 MG tablet Commonly known as:  COZAAR Take 100 mg by mouth daily.   NUCYNTA 75 MG tablet Generic drug:  tapentadol HCl Take 75 mg by mouth.   polyethylene glycol powder powder Commonly known as:  GLYCOLAX/MIRALAX Take 17 g by mouth every other day.   solifenacin 5 MG tablet Commonly known as:  VESICARE Take 5 mg by mouth daily.       Allergies:  Allergies  Allergen Reactions  . Celecoxib     REACTION: Rash  . Lisinopril Other (See Comments)    Other reaction(s): Other (See Comments) Angioedema on EGD Angioedema on EGD   .  Sulfacetamide Sodium     REACTION: Patient is allergic to sulfa type products.  . Statins Rash    Family History: Family History  Problem Relation Age of Onset  . Hypertension Mother     Social History:  reports that she has never smoked. She has never used smokeless tobacco. Her alcohol and drug histories are not on file.  ROS: UROLOGY Frequent Urination?: Yes Hard to postpone urination?: No Burning/pain with urination?: No Get up at night to urinate?: Yes Leakage of urine?: Yes Urine stream starts and stops?: No Trouble starting stream?: No Do you have to strain to urinate?: Yes Blood in urine?: No Urinary tract infection?: Yes Sexually transmitted disease?: No Injury to kidneys or bladder?: No Painful intercourse?: No Weak stream?: No Currently pregnant?: No Vaginal bleeding?: No  Gastrointestinal Nausea?: No Vomiting?: No Indigestion/heartburn?: No Diarrhea?: Yes Constipation?: Yes  Constitutional Fever: No Night sweats?: No Weight loss?: Yes Fatigue?: Yes  Skin Skin rash/lesions?: Yes Itching?: No  Eyes Blurred vision?: No Double vision?: No  Ears/Nose/Throat Sore throat?: No Sinus problems?: No  Hematologic/Lymphatic Swollen glands?: No Easy bruising?: No  Cardiovascular Leg swelling?: No Chest pain?: No  Respiratory Cough?: No Shortness of breath?: No  Endocrine Excessive thirst?: No  Musculoskeletal Back pain?: No Joint pain?: No  Neurological Headaches?: No Dizziness?: No  Psychologic Depression?: No Anxiety?: No  Physical Exam: BP 134/79   Pulse 84   Resp 16   Ht 5' 1.5" (1.562 m)   Wt 147 lb 6.4 oz (66.9 kg)   SpO2 98%   BMI 27.40 kg/m   Constitutional: Well nourished. Alert and oriented, No acute distress. HEENT:  AT, moist mucus membranes. Trachea midline, no masses. Cardiovascular: No clubbing, cyanosis, or edema. Respiratory: Normal respiratory effort, no increased work of breathing. GI: Abdomen is  soft, non tender, non distended, no abdominal masses. Liver and spleen not palpable.  No hernias appreciated.  Stool sample for occult testing is not indicated.   GU: No CVA tenderness.  No bladder fullness or masses.  Atrophic external genitalia, normal pubic hair distribution, no lesions.  Normal urethral meatus, no lesions, no prolapse, no discharge.   No urethral masses, tenderness and/or tenderness. No bladder fullness, tenderness or masses. Pale vagina mucosa, poor estrogen effect, no discharge, no lesions, good pelvic support, Grade I cystocele.  No  rectocele noted.  Cervix, uterus and adnexa are surgically absent.  Anus and perineum are without rashes or lesions.    Skin: No rashes, bruises or suspicious lesions. Lymph: No cervical or inguinal adenopathy. Neurologic: Grossly intact, no focal deficits, moving all 4 extremities. Psychiatric: Normal mood and affect.  Laboratory Data: Lab Results  Component Value Date  WBC 6.8 02/07/2017   HGB 13.9 02/07/2017   HCT 41.5 02/07/2017   MCV 96.7 02/07/2017   PLT 180 02/07/2017    Lab Results  Component Value Date   CREATININE 1.67 (H) 02/07/2017    No results found for: PSA  No results found for: TESTOSTERONE  No results found for: HGBA1C  Lab Results  Component Value Date   TSH 3.14 02/03/2008       Component Value Date/Time   CHOL 223 (HH) 02/03/2008 1032   HDL 38.2 (L) 02/03/2008 1032   CHOLHDL 5.8 CALC 02/03/2008 1032   VLDL 48 (H) 02/03/2008 1032    Lab Results  Component Value Date   AST 19 02/03/2008   Lab Results  Component Value Date   ALT 13 02/03/2008   No components found for: ALKALINEPHOPHATASE No components found for: BILIRUBINTOTAL  No results found for: ESTRADIOL  Urinalysis No results found for: COLORURINE, APPEARANCEUR, LABSPEC, PHURINE, GLUCOSEU, HGBUR, BILIRUBINUR, KETONESUR, PROTEINUR, UROBILINOGEN, NITRITE, LEUKOCYTESUR  I have reviewed the labs.   Assessment & Plan:    1. Urge  Incontinence  - discussed behavioral therapies, bladder training, bladder control strategies and pelvic floor muscle training - patient does not desire referral at this time  - fluid management - encouraged patient to increase water intake  - offered medical therapy with a beta-3 adrenergic receptor agonist - would like to try the beta-3 adrenergic receptor agonist (Myrbetriq).  Given Myrbetriq 25 mg samples, #28.  I have reviewed with the patient of the side effects of Myrbetriq, such as: elevation in BP, urinary retention and/or HA.    - discontinue the Vesicare  - RTC in 3 weeks for PVR and OAB questionnaire  2. Vaginal atrophy I explained to the patient that when women go through menopause and her estrogen levels are severely diminished, the normal vaginal flora will change.  This is due to an increase of the vaginal canal's pH. Because of this, the vaginal canal may be colonized by bacteria from the rectum instead of the protective lactobacillus.  This, accompanied by the loss of the mucus barrier with vaginal atrophy, is a cause of recurrent urinary tract infections. In some studies, the use of vaginal estrogen cream has been demonstrated to reduce  recurrent urinary tract infections to one a year.  Patient was given a sample of vaginal estrogen cream (Premarin vaginal cream) and instructed to apply 0.5mg  (pea-sized amount)  just inside the vaginal introitus with a finger-tip on Monday, Wednesday and Friday nights.  I explained to the patient that vaginally administered estrogen, which causes only a slight increase in the blood estrogen levels, have fewer contraindications and adverse systemic effects that oral HT. I have also given prescriptions for the Estrace cream and Premarin cream, so that the patient may carry them to the pharmacy to see which one of the branded creams would be most economical for her.  If she finds both medications cost prohibitive, she is instructed to call the office.  We  can then call in a compounded vaginal estrogen cream for the patient that may be more affordable.   She will follow up in three months for an exam.    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  I have given her the number to [facility].     Return in about 3 weeks (around 02/19/2018), or if symptoms worsen or fail to improve, for PVR and OAB questionnaire.  These notes  generated with voice recognition software. I apologize for typographical errors.  Zara Council, [facility] [address] [place], [place] [address] [phone]

## 2018-01-29 ENCOUNTER — Ambulatory Visit (INDEPENDENT_AMBULATORY_CARE_PROVIDER_SITE_OTHER): Payer: Medicare Other | Admitting: Urology

## 2018-01-29 VITALS — BP 134/79 | HR 84 | Resp 16 | Ht 61.5 in | Wt 147.4 lb

## 2018-01-29 DIAGNOSIS — N952 Postmenopausal atrophic vaginitis: Secondary | ICD-10-CM | POA: Diagnosis not present

## 2018-01-29 DIAGNOSIS — N3946 Mixed incontinence: Secondary | ICD-10-CM

## 2018-01-29 LAB — BLADDER SCAN AMB NON-IMAGING

## 2018-01-29 MED ORDER — ESTROGENS, CONJUGATED 0.625 MG/GM VA CREA: 1.0000 | TOPICAL_CREAM | Freq: Every day | VAGINAL | 12 refills | Status: DC

## 2018-01-29 MED ORDER — ESTRADIOL 0.1 MG/GM VA CREA: TOPICAL_CREAM | VAGINAL | 12 refills | Status: DC

## 2018-01-29 NOTE — Patient Instructions (Signed)
[facility]  [phone]  I have given you two prescriptions for a vaginal estrogen cream.  Estrace and Premarin.  Please take these to your pharmacy and see which one your insurance covers.  If both are too expensive, please call the office at [phone] for an alternative.  You are given a sample of vaginal estrogen cream Premarin and instructed to apply 0.5mg  (pea-sized amount)  just inside the vaginal introitus with a finger-tip on Monday, Wednesday and Friday nights,

## 2018-02-08 ENCOUNTER — Encounter: Payer: Self-pay | Admitting: Urology

## 2018-02-08 DIAGNOSIS — E039 Hypothyroidism, unspecified: Secondary | ICD-10-CM | POA: Insufficient documentation

## 2018-02-18 ENCOUNTER — Telehealth: Payer: Self-pay | Admitting: Urology

## 2018-02-18 NOTE — Telephone Encounter (Signed)
That is fine for her to have more samples.

## 2018-02-18 NOTE — Telephone Encounter (Signed)
Samples left at front 

## 2018-02-18 NOTE — Telephone Encounter (Signed)
Patient needs more samples of myrbetriq until her follow up app on 03-21-18. We had to move her app out.  Thanks, Marcelino Duster

## 2018-02-18 NOTE — Telephone Encounter (Signed)
Thayer Ohm, Can you get these samples ready for this patient? She is aware   Thanks, Marcelino Duster

## 2018-02-21 ENCOUNTER — Ambulatory Visit: Payer: Medicare Other | Admitting: Urology

## 2018-03-20 NOTE — Progress Notes (Signed)
03/21/2018 12:07 PM   Jenna Prince Mar 12, 1929 [id number]  Referring provider: Lauro Regulus, MD [address] [facility] [place], [place] [address]  Chief Complaint  Patient presents with  . urge incontinence    HPI: 84 yo WF with urge incontinence and vaginal atrophy who presents today for follow up with her daughter, Jenna Prince.    Background history Patient is a 68 -year-old Caucasian female who is referred to [place] by Dr. Einar Prince for urinary incontinence with her husband, Jenna Prince.  Patient states that she has had urinary incontinence for one year.  Patient has incontinence with urge   She is experiencing several incontinent episodes during the day. She is experiencing one incontinent episodes during the night.  Her incontinence volume is moderate to large.   She is wearing one to two pads/depends daily.  She is having associated urinary frequency, nocturia x 2-3 and straining to urinate.  She's had a sacral pain for 2 weeks.  Patient denies any gross hematuria, dysuria or suprapubic/flank pain.  Patient denies any fevers, chills, nausea or vomiting.  Her PVR is 227 mL.  She does not have a history of urinary tract infections, STI's or injury to the bladder.   She does not have a history of nephrolithiasis, GU surgery or GU trauma.  She is post menopausal.  She admits to diarrhea.  She has not had any recent imaging studies.  She is not drinking a lot of water daily.   She is drinking 4 cups of coffee daily.  She drinks one glass of orange juice daily.  She drinks two to three glasses of sweet tea daily.  She does not drink sodas.  She does not drink alcohol.  Reviewed referral notes.  Patient on Vesicare 5 mg daily  Today, she is experiencing urgency x 4-7, frequency x 4-7, not restricting fluids to avoid visits to the restroom, is engaging in toilet mapping, incontinence x 0-3 and nocturia x 0-3 (improved).   Patient denies any gross hematuria, dysuria or  suprapubic/flank pain.  Patient denies any fevers, chills, nausea or vomiting.   She has found the Myrbetriq 25 mg daily helpful.   Her BP is 129/68.   Her PVR is 80 mL.    She is using the vaginal estrogen cream three nights weekly.  She is not experiencing vaginal bleeding, itching or rash.    PMH: Past Medical History:  Diagnosis Date  . Arthritis   . Constipation   . CVA (cerebral vascular accident) (HCC)   . CVA (cerebral vascular accident) (HCC)   . HLD (hyperlipidemia)   . Hypertension   . Hypothyroidism   . Obesity     Surgical History: Knee replacement 2012  Home Medications:  Allergies as of 03/21/2018      Reactions   Celecoxib    REACTION: Rash   Lisinopril Other (See Comments)   Other reaction(s): Other (See Comments) Angioedema on EGD Angioedema on EGD   Sulfacetamide Sodium    REACTION: Patient is allergic to sulfa type products.   Statins Rash      Medication List        Accurate as of 03/21/18 12:07 PM. Always use your most recent med list.          aspirin EC 81 MG tablet Take 81 mg by mouth daily.   citalopram 20 MG tablet Commonly known as:  CELEXA Take 20 mg by mouth daily.   conjugated estrogens vaginal  cream Commonly known as:  PREMARIN Place 1 Applicatorful vaginally daily. Apply 0.5mg  (pea-sized amount)  just inside the vaginal introitus with a finger-tip on  Monday, Wednesday and Friday nights.   estradiol 0.1 MG/GM vaginal cream Commonly known as:  ESTRACE VAGINAL Apply 0.5mg  (pea-sized amount)  just inside the vaginal introitus with a finger-tip on Monday, Wednesday and Friday nights.   hydrochlorothiazide 12.5 MG capsule Commonly known as:  MICROZIDE Take 12.5 mg by mouth daily.   levothyroxine 50 MCG tablet Commonly known as:  SYNTHROID, LEVOTHROID Take 50 mcg by mouth daily before breakfast.   losartan 100 MG tablet Commonly known as:  COZAAR Take 100 mg by mouth daily.   mirabegron ER 25 MG Tb24 tablet Commonly known  as:  MYRBETRIQ Take 1 tablet (25 mg total) by mouth daily.   NUCYNTA 75 MG tablet Generic drug:  tapentadol HCl Take 75 mg by mouth.   polyethylene glycol powder powder Commonly known as:  GLYCOLAX/MIRALAX Take 17 g by mouth every other day.   solifenacin 5 MG tablet Commonly known as:  VESICARE Take 5 mg by mouth daily.       Allergies:  Allergies  Allergen Reactions  . Celecoxib     REACTION: Rash  . Lisinopril Other (See Comments)    Other reaction(s): Other (See Comments) Angioedema on EGD Angioedema on EGD   . Sulfacetamide Sodium     REACTION: Patient is allergic to sulfa type products.  . Statins Rash    Family History: Family History  Problem Relation Age of Onset  . Hypertension Mother     Social History:  reports that she has never smoked. She has never used smokeless tobacco. Her alcohol and drug histories are not on file.  ROS: UROLOGY Frequent Urination?: Yes Hard to postpone urination?: No Burning/pain with urination?: No Get up at night to urinate?: No Leakage of urine?: No Urine stream starts and stops?: No Trouble starting stream?: No Do you have to strain to urinate?: No Blood in urine?: No Urinary tract infection?: No Sexually transmitted disease?: No Injury to kidneys or bladder?: No Painful intercourse?: No Weak stream?: No Currently pregnant?: No Vaginal bleeding?: No Last menstrual period?: n  Gastrointestinal Nausea?: No Vomiting?: No Indigestion/heartburn?: No Diarrhea?: No Constipation?: No  Constitutional Fever: No Night sweats?: No Weight loss?: Yes Fatigue?: No  Skin Skin rash/lesions?: No Itching?: No  Eyes Blurred vision?: No Double vision?: No  Ears/Nose/Throat Sore throat?: No Sinus problems?: No  Hematologic/Lymphatic Swollen glands?: No Easy bruising?: No  Cardiovascular Leg swelling?: No Chest pain?: No  Respiratory Cough?: No Shortness of breath?: No  Endocrine Excessive thirst?:  No  Musculoskeletal Back pain?: No Joint pain?: No  Neurological Headaches?: No Dizziness?: No  Psychologic Depression?: No Anxiety?: No  Physical Exam: BP 129/68   Pulse 70   Ht 5' 1.5" (1.562 m)   Wt 142 lb 8 oz (64.6 kg)   BMI 26.49 kg/m   Constitutional: Well nourished. Alert and oriented, No acute distress. HEENT:  AT, moist mucus membranes. Trachea midline, no masses. Cardiovascular: No clubbing, cyanosis, or edema. Respiratory: Normal respiratory effort, no increased work of breathing. GI: Abdomen is soft, non tender, non distended, no abdominal masses. Liver and spleen not palpable.  No hernias appreciated.  Stool sample for occult testing is not indicated.   GU: No CVA tenderness.  No bladder fullness or masses.  Atrophic external genitalia, normal pubic hair distribution, no lesions.  Normal urethral meatus, no lesions, no prolapse, no  discharge.   No urethral masses, tenderness and/or tenderness. No bladder fullness, tenderness or masses. Pale vagina mucosa, poor estrogen effect, no discharge, no lesions, good pelvic support, Grade I cystocele No rectocele noted. Cervix, uterus and adnexa are surgically absent.  Anus and perineum are without rashes or lesions.    Skin: No rashes, bruises or suspicious lesions. Lymph: No cervical or inguinal adenopathy. Neurologic: Grossly intact, no focal deficits, moving all 4 extremities. Psychiatric: Normal mood and affect.  Laboratory Data: Lab Results  Component Value Date   WBC 6.8 02/07/2017   HGB 13.9 02/07/2017   HCT 41.5 02/07/2017   MCV 96.7 02/07/2017   PLT 180 02/07/2017    Lab Results  Component Value Date   CREATININE 1.67 (H) 02/07/2017    No results found for: PSA  No results found for: TESTOSTERONE  No results found for: HGBA1C  Lab Results  Component Value Date   TSH 3.14 02/03/2008       Component Value Date/Time   CHOL 223 (HH) 02/03/2008 1032   HDL 38.2 (L) 02/03/2008 1032   CHOLHDL  5.8 CALC 02/03/2008 1032   VLDL 48 (H) 02/03/2008 1032    Lab Results  Component Value Date   AST 19 02/03/2008   Lab Results  Component Value Date   ALT 13 02/03/2008   No components found for: ALKALINEPHOPHATASE No components found for: BILIRUBINTOTAL  No results found for: ESTRADIOL  Urinalysis No results found for: COLORURINE, APPEARANCEUR, LABSPEC, PHURINE, GLUCOSEU, HGBUR, BILIRUBINUR, KETONESUR, PROTEINUR, UROBILINOGEN, NITRITE, LEUKOCYTESUR  I have reviewed the labs.  Pertinent Imaging Results for CARLYNN, LEDUC (MRN [id number]) as of 03/21/2018 11:47  Ref. Range 03/21/2018 11:35  Scan Result Unknown 80     Assessment & Plan:    1. Urge Incontinence Patient has found the Myrbetriq 25 mg daily helpful, refills are sent to pharmacy She will follow-up in 1 year for OAB questionnaire and PVR  2. Vaginal atrophy She will continue the vaginal estrogen cream 3 nights weekly Prescription sent to her pharmacy She will follow-up in 1 year for exam  Return in about 1 year (around 03/22/2019) for OAB questionnaire, PVR and exam.  These notes generated with voice recognition software. I apologize for typographical errors.  Michiel Cowboy, PA-C  [facility] [address] [place], [place] [address] [phone]

## 2018-03-21 ENCOUNTER — Ambulatory Visit (INDEPENDENT_AMBULATORY_CARE_PROVIDER_SITE_OTHER): Payer: Medicare Other | Admitting: Urology

## 2018-03-21 ENCOUNTER — Encounter: Payer: Self-pay | Admitting: Urology

## 2018-03-21 VITALS — BP 129/68 | HR 70 | Ht 61.5 in | Wt 142.5 lb

## 2018-03-21 DIAGNOSIS — N952 Postmenopausal atrophic vaginitis: Secondary | ICD-10-CM

## 2018-03-21 DIAGNOSIS — N3946 Mixed incontinence: Secondary | ICD-10-CM

## 2018-03-21 LAB — BLADDER SCAN AMB NON-IMAGING: SCAN RESULT: 80

## 2018-03-21 MED ORDER — ESTROGENS, CONJUGATED 0.625 MG/GM VA CREA
1.0000 | TOPICAL_CREAM | Freq: Every day | VAGINAL | 12 refills | Status: DC
Start: 2018-03-21 — End: 2019-03-26

## 2018-03-21 MED ORDER — MIRABEGRON ER 25 MG PO TB24: 25.0000 mg | ORAL_TABLET | Freq: Every day | ORAL | 3 refills | Status: DC

## 2018-09-09 ENCOUNTER — Other Ambulatory Visit: Payer: Self-pay | Admitting: Family Medicine

## 2019-03-18 ENCOUNTER — Other Ambulatory Visit: Payer: Self-pay | Admitting: Family Medicine

## 2019-03-18 MED ORDER — ESTRADIOL 0.1 MG/GM VA CREA: TOPICAL_CREAM | VAGINAL | 12 refills | Status: DC

## 2019-03-25 NOTE — Progress Notes (Signed)
03/26/2019 11:47 AM   Jenna Prince 10-26-1928 [id number]  Referring provider: Lauro Regulus, MD [address] [facility] [place], [place] [address]  Chief Complaint  Patient presents with  . Urinary Incontinence    HPI: 85 yo WF with urge incontinence and vaginal atrophy who presents today for follow up.    Urge incontinence Risk factors: age, moderate caffeine consumption, two pads daily.  The patient is  experiencing urgency x 4-7 (stable), frequency x 4-7 (stable), not restricting fluids to avoid visits to the restroom, not engaging in toilet mapping, incontinence x 4-7 (worsening) and nocturia x 0-3 (stable).    Her BP is 144/76.   Her PVR is 0 mL.   Patient denies any gross hematuria, dysuria or suprapubic/flank pain.  Patient denies any fevers, chills, nausea or vomiting.    Vaginal atrophy She is applying the vaginal estrogen cream TID weekly.  She is not having vaginal burning or itching.    PMH: Past Medical History:  Diagnosis Date  . Arthritis   . Constipation   . CVA (cerebral vascular accident) (HCC)   . CVA (cerebral vascular accident) (HCC)   . HLD (hyperlipidemia)   . Hypertension   . Hypothyroidism   . Obesity     Surgical History: Knee replacement 2012  Home Medications:  Allergies as of 03/26/2019      Reactions   Celecoxib    REACTION: Rash   Lisinopril Other (See Comments)   Other reaction(s): Other (See Comments) Angioedema on EGD Angioedema on EGD   Sulfacetamide Sodium    REACTION: Patient is allergic to sulfa type products.   Statins Rash      Medication List       Accurate as of March 26, 2019 11:47 AM. If you have any questions, ask your nurse or doctor.        STOP taking these medications   conjugated estrogens vaginal cream Commonly known as:  Premarin Stopped by:  Marium Ragan, PA-C   solifenacin 5 MG tablet Commonly known as:  VESICARE Stopped by:  Michiel Cowboy, PA-C     TAKE these  medications   aspirin EC 81 MG tablet Take 81 mg by mouth daily.   citalopram 20 MG tablet Commonly known as:  CELEXA Take 20 mg by mouth daily.   estradiol 0.1 MG/GM vaginal cream Commonly known as:  ESTRACE VAGINAL Apply 0.5mg  (pea-sized amount)  just inside the vaginal introitus with a finger-tip on Monday, Wednesday and Friday nights.   hydrochlorothiazide 12.5 MG capsule Commonly known as:  MICROZIDE Take 12.5 mg by mouth daily.   levothyroxine 50 MCG tablet Commonly known as:  SYNTHROID Take 50 mcg by mouth daily before breakfast.   losartan 100 MG tablet Commonly known as:  COZAAR Take 100 mg by mouth daily.   mirabegron ER 25 MG Tb24 tablet Commonly known as:  MYRBETRIQ Take 1 tablet (25 mg total) by mouth daily.   Nucynta 75 MG tablet Generic drug:  tapentadol HCl Take 75 mg by mouth.   polyethylene glycol powder 17 GM/SCOOP powder Commonly known as:  GLYCOLAX/MIRALAX Take 17 g by mouth every other day.       Allergies:  Allergies  Allergen Reactions  . Celecoxib     REACTION: Rash  . Lisinopril Other (See Comments)    Other reaction(s): Other (See Comments) Angioedema on EGD Angioedema on EGD   . Sulfacetamide Sodium     REACTION: Patient is allergic  to sulfa type products.  . Statins Rash    Family History: Family History  Problem Relation Age of Onset  . Hypertension Mother     Social History:  reports that she has never smoked. She has never used smokeless tobacco. No history on file for alcohol and drug.  ROS: UROLOGY Frequent Urination?: No Hard to postpone urination?: No Burning/pain with urination?: No Get up at night to urinate?: No Leakage of urine?: No Urine stream starts and stops?: No Trouble starting stream?: No Do you have to strain to urinate?: No Blood in urine?: No Urinary tract infection?: No Sexually transmitted disease?: No Injury to kidneys or bladder?: No Painful intercourse?: No Weak stream?: No Currently  pregnant?: No Vaginal bleeding?: No Last menstrual period?: n  Gastrointestinal Nausea?: No Vomiting?: No Indigestion/heartburn?: No Diarrhea?: No Constipation?: No  Constitutional Fever: No Night sweats?: No Weight loss?: No Fatigue?: No  Skin Skin rash/lesions?: No Itching?: No  Eyes Blurred vision?: No Double vision?: No  Ears/Nose/Throat Sore throat?: No Sinus problems?: No  Hematologic/Lymphatic Swollen glands?: No Easy bruising?: No  Cardiovascular Leg swelling?: No Chest pain?: No  Respiratory Cough?: No Shortness of breath?: No  Endocrine Excessive thirst?: No  Musculoskeletal Back pain?: No Joint pain?: No  Neurological Headaches?: No Dizziness?: No  Psychologic Depression?: No Anxiety?: No  Physical Exam: BP (!) 144/76 (BP Location: Left Arm, Patient Position: Sitting, Cuff Size: Normal)   Pulse 69   Ht 5' 1.5" (1.562 m)   Wt 133 lb 6.4 oz (60.5 kg)   BMI 24.80 kg/m   Constitutional:  Well nourished. Alert and oriented, No acute distress. HEENT: [place] AT, moist mucus membranes.  Trachea midline, no masses. Cardiovascular: No clubbing, cyanosis, or edema. Respiratory: Normal respiratory effort, no increased work of breathing. Neurologic: Grossly intact, no focal deficits, moving all 4 extremities. Psychiatric: Normal mood and affect.   Laboratory Data: Lab Results  Component Value Date   WBC 6.8 02/07/2017   HGB 13.9 02/07/2017   HCT 41.5 02/07/2017   MCV 96.7 02/07/2017   PLT 180 02/07/2017    Lab Results  Component Value Date   CREATININE 1.67 (H) 02/07/2017    No results found for: PSA  No results found for: TESTOSTERONE  No results found for: HGBA1C  Lab Results  Component Value Date   TSH 3.14 02/03/2008       Component Value Date/Time   CHOL 223 (HH) 02/03/2008 1032   HDL 38.2 (L) 02/03/2008 1032   CHOLHDL 5.8 CALC 02/03/2008 1032   VLDL 48 (H) 02/03/2008 1032    Lab Results  Component Value Date    AST 19 02/03/2008   Lab Results  Component Value Date   ALT 13 02/03/2008   No components found for: ALKALINEPHOPHATASE No components found for: BILIRUBINTOTAL  No results found for: ESTRADIOL  Urinalysis No results found for: COLORURINE, APPEARANCEUR, LABSPEC, PHURINE, GLUCOSEU, HGBUR, BILIRUBINUR, KETONESUR, PROTEINUR, UROBILINOGEN, NITRITE, LEUKOCYTESUR  I have reviewed the labs.  Pertinent Imaging Results for Jenna CarawayMUNN, Jenna K (MRN [id number]) as of 03/26/2019 11:50  Ref. Range 03/26/2019 11:35  Scan Result Unknown 0     Assessment & Plan:    1. Urge Incontinence At goal with Myrbetriq 25 mg daily; script sent to pharmacy RTC in one year for OAB questionnaire and PVR  2. Vaginal atrophy Continue vaginal estrogen cream TID weekly; script sent to pharmacy    Return in about 1 year (around 03/25/2020) for OAB questionnaire, PVR and exam.  These notes  generated with voice recognition software. I apologize for typographical errors.  Zara Council, PA-C  [facility] [address] [place], [place] [address] [phone]

## 2019-03-26 ENCOUNTER — Ambulatory Visit (INDEPENDENT_AMBULATORY_CARE_PROVIDER_SITE_OTHER): Payer: Medicare Other | Admitting: Urology

## 2019-03-26 ENCOUNTER — Encounter: Payer: Self-pay | Admitting: Urology

## 2019-03-26 ENCOUNTER — Other Ambulatory Visit: Payer: Self-pay

## 2019-03-26 VITALS — BP 144/76 | HR 69 | Ht 61.5 in | Wt 133.4 lb

## 2019-03-26 DIAGNOSIS — N952 Postmenopausal atrophic vaginitis: Secondary | ICD-10-CM

## 2019-03-26 DIAGNOSIS — N3946 Mixed incontinence: Secondary | ICD-10-CM | POA: Diagnosis not present

## 2019-03-26 LAB — BLADDER SCAN AMB NON-IMAGING: Scan Result: 0

## 2019-03-26 MED ORDER — ESTRADIOL 0.1 MG/GM VA CREA: TOPICAL_CREAM | VAGINAL | 12 refills | Status: DC

## 2019-03-26 MED ORDER — MIRABEGRON ER 25 MG PO TB24: 25.0000 mg | ORAL_TABLET | Freq: Every day | ORAL | 3 refills | Status: AC

## 2019-03-31 ENCOUNTER — Telehealth: Payer: Self-pay | Admitting: Urology

## 2019-03-31 NOTE — Telephone Encounter (Signed)
[facility] pharmacy calling, asserting for the 3rd time, to verify a script for estradiol. Specifically wanting to verify the quantity.   Call # = [phone]  Reference numbver [id number]

## 2019-03-31 NOTE — Telephone Encounter (Signed)
Spoke with [facility] they are mail order and want to ok filling a 42.5gm tube instead of the prescribed 30mg . Verbal ok for the 42.5gm with 6 refills given

## 2020-03-12 ENCOUNTER — Other Ambulatory Visit: Payer: Self-pay | Admitting: Urology

## 2020-03-25 ENCOUNTER — Ambulatory Visit: Payer: Medicare Other | Admitting: Urology

## 2020-03-25 ENCOUNTER — Encounter: Payer: Self-pay | Admitting: Urology

## 2020-12-21 DEATH — deceased
